# Patient Record
Sex: Female | Born: 1995 | Race: Black or African American | Hispanic: No | Marital: Single | Smoking: Never smoker
Health system: Southern US, Community
[De-identification: ages and names within clinical notes are randomized; demographics above are authoritative.]

---

## 2018-05-31 ENCOUNTER — Encounter (HOSPITAL_COMMUNITY): Payer: Self-pay | Admitting: Emergency Medicine

## 2018-05-31 ENCOUNTER — Ambulatory Visit (HOSPITAL_COMMUNITY)
Admission: EM | Admit: 2018-05-31 | Discharge: 2018-05-31 | Disposition: A | Discharge status: Home / Self Care | Payer: Managed Care, Other (non HMO) | Attending: Family Medicine | Admitting: Family Medicine

## 2018-05-31 DIAGNOSIS — S90221A Contusion of right lesser toe(s) with damage to nail, initial encounter: Secondary | ICD-10-CM

## 2018-05-31 DIAGNOSIS — S90222A Contusion of left lesser toe(s) with damage to nail, initial encounter: Secondary | ICD-10-CM

## 2018-05-31 NOTE — ED Provider Notes (Signed)
[facility]    CSN: [id number] Arrival date & time: 05/31/18  1204     History   Chief Complaint Chief Complaint  Patient presents with  . Toe Pain    HPI Angela Swanson is a 22 y.o. female.   Angela Swanson presents with complaints of toe pain to bilateral second toes. States yesterday was her first day back at school so she was walking a lot, wearing a new shoe with a device to prevent creasing to the toe box. She feels this likely made the shoe too tight. Today with pain to toe nails. No bleeding or blistering. No other injury. States nails feel a numb sensation but no numbness to toes. Without contributing medical history.      ROS per HPI.      History reviewed. No pertinent past medical history.  There are no active problems to display for this patient.   History reviewed. No pertinent surgical history.  OB History   None      Home Medications    Prior to Admission medications   Not on File    Family History Family History  Problem Relation Age of Onset  . Diabetes Father     Social History Social History   Tobacco Use  . Smoking status: Never Smoker  Substance Use Topics  . Alcohol use: Never    Frequency: Never  . Drug use: Never     Allergies   Patient has no known allergies.   Review of Systems Review of Systems   Physical Exam Triage Vital Signs ED Triage Vitals [05/31/18 1221]  Enc Vitals Group     BP (!) 141/79     Pulse Rate 87     Resp 18     Temp 98.3 F (36.8 C)     Temp src      SpO2 100 %     Weight      Height      Head Circumference      Peak Flow      Pain Score      Pain Loc      Pain Edu?      Excl. in GC?    No data found.  Updated Vital Signs BP (!) 141/79   Pulse 87   Temp 98.3 F (36.8 C)   Resp 18   LMP 05/16/2018   SpO2 100%   Visual Acuity Right Eye Distance:   Left Eye Distance:   Bilateral Distance:    Right Eye Near:   Left Eye Near:    Bilateral Near:     Physical Exam   Constitutional: She is oriented to person, place, and time. She appears well-developed and well-nourished. No distress.  Cardiovascular: Normal rate, regular rhythm and normal heart sounds.  Pulmonary/Chest: Effort normal and breath sounds normal.  Musculoskeletal:       Right ankle: Normal.       Left ankle: Normal.       Right foot: There is tenderness. There is no bony tenderness.       Left foot: There is tenderness. There is no bony tenderness.  Bilateral 2nd toes with slight bruising and tenderness to nails; very slight subungual hematomas present bilaterally; sensation intact; no pain to bones of toes; cap refill < 2 seconds    Neurological: She is alert and oriented to person, place, and time.  Skin: Skin is warm and dry.     UC Treatments / Results  Labs (all labs  ordered are listed, but only abnormal results are displayed) Labs Reviewed - No data to display  EKG None  Radiology No results found.  Procedures Procedures (including critical care time)  Medications Ordered in UC Medications - No data to display  Initial Impression / Assessment and Plan / UC Course  I have reviewed the triage vital signs and the nursing notes.  Pertinent labs & imaging results that were available during my care of the patient were reviewed by me and considered in my medical decision making (see chart for details).     Inadequate hematoma's for drainage at this time. Emphasized significance of appropriate fitting and laced shoes to prevent further pain. Ice application, ibuprofen as needed. Follow up with podiatry for any worsening or persistent symptoms.  Final Clinical Impressions(s) / UC Diagnoses   Final diagnoses:  Contusion of toenail of left foot, initial encounter  Contusion of toenail of right foot, initial encounter     Discharge Instructions     Apply ice at end of day.  Wear well fitting shoes to prevent further damage to the toe nails, ensured laced adequately as  well.  These toe nails may fall off as new nails grow in.  If any worsening or persistent concerns please follow up with podiatry     ED Prescriptions    None     Controlled Substance Prescriptions [place] Controlled Substance Registry consulted? Not Applicable   Georgetta HaberBurky, Natalie B, NP 05/31/18 1312

## 2018-05-31 NOTE — Discharge Instructions (Signed)
Apply ice at end of day.  Wear well fitting shoes to prevent further damage to the toe nails, ensured laced adequately as well.  These toe nails may fall off as new nails grow in.  If any worsening or persistent concerns please follow up with podiatry

## 2018-05-31 NOTE — ED Triage Notes (Signed)
Pt states she was walking with some new shoes she thinks might be too tight, caused some bilateral toe pain. Pt ambulatory. Denies specific injury.

## 2018-08-08 ENCOUNTER — Ambulatory Visit (HOSPITAL_COMMUNITY)
Admission: EM | Admit: 2018-08-08 | Discharge: 2018-08-08 | Disposition: A | Discharge status: Home / Self Care | Payer: Managed Care, Other (non HMO) | Attending: Family Medicine | Admitting: Family Medicine

## 2018-08-08 ENCOUNTER — Encounter (HOSPITAL_COMMUNITY): Payer: Self-pay | Admitting: Emergency Medicine

## 2018-08-08 DIAGNOSIS — R350 Frequency of micturition: Secondary | ICD-10-CM | POA: Diagnosis not present

## 2018-08-08 LAB — POCT URINALYSIS DIP (DEVICE)
Bilirubin Urine: NEGATIVE
GLUCOSE, UA: NEGATIVE mg/dL
HGB URINE DIPSTICK: NEGATIVE
KETONES UR: NEGATIVE mg/dL
LEUKOCYTES UA: NEGATIVE
NITRITE: NEGATIVE
PH: 5.5 (ref 5.0–8.0)
PROTEIN: NEGATIVE mg/dL
SPECIFIC GRAVITY, URINE: 1.025 (ref 1.005–1.030)
UROBILINOGEN UA: 0.2 mg/dL (ref 0.0–1.0)

## 2018-08-08 LAB — POCT PREGNANCY, URINE: PREG TEST UR: NEGATIVE

## 2018-08-08 NOTE — ED Provider Notes (Signed)
[facility]    CSN: [id number] Arrival date & time: 08/08/18  1137     History   Chief Complaint Chief Complaint  Patient presents with  . Urinary Frequency    HPI Angela Swanson is a 22 y.o. female.   Pt is a 22 year old female that presents with urinary frequency, dark urine, missed period. This has been a problem for a dew days. Symptoms have been constant. She had an abortion earlier this month and shortly after started on OC. She is concerned because she hasn't started her period and worried that she is pregnant again. She is sexually active. She denies any abd pain, vaginal bleeding or discharge. She denies any dysuria, hematuria.   ROS per HPI      History reviewed. No pertinent past medical history.  There are no active problems to display for this patient.   History reviewed. No pertinent surgical history.  OB History   None      Home Medications    Prior to Admission medications   Medication Sig Start Date End Date Taking? Authorizing Provider  Norgestimate-Eth Estradiol (SPRINTEC 28 PO) Take by mouth.   Yes [provider]    Family History Family History  Problem Relation Age of Onset  . Diabetes Father     Social History Social History   Tobacco Use  . Smoking status: Never Smoker  . Smokeless tobacco: Never Used  Substance Use Topics  . Alcohol use: Never    Frequency: Never  . Drug use: Never     Allergies   Patient has no known allergies.   Review of Systems Review of Systems   Physical Exam Triage Vital Signs ED Triage Vitals  Enc Vitals Group     BP 08/08/18 1209 132/68     Pulse Rate 08/08/18 1209 76     Resp 08/08/18 1209 16     Temp 08/08/18 1209 98.6 F (37 C)     Temp Source 08/08/18 1209 Oral     SpO2 08/08/18 1209 100 %     Weight --      Height 08/08/18 1210 5\' 9"  (1.753 m)     Head Circumference --      Peak Flow --      Pain Score 08/08/18 1209 3     Pain Loc --      Pain Edu? --       Excl. in GC? --    No data found.  Updated Vital Signs BP 132/68 (BP Location: Right Arm)   Pulse 76   Temp 98.6 F (37 C) (Oral)   Resp 16   Ht 5\' 9"  (1.753 m)   LMP 05/16/2018   SpO2 100%   Visual Acuity Right Eye Distance:   Left Eye Distance:   Bilateral Distance:    Right Eye Near:   Left Eye Near:    Bilateral Near:     Physical Exam  Constitutional: She appears well-developed and well-nourished.  Very pleasant. Non toxic or ill appearing.   HENT:  Head: Normocephalic and atraumatic.  Eyes: Conjunctivae are normal.  Neck: Normal range of motion.  Pulmonary/Chest: Effort normal.  Abdominal: Soft. Bowel sounds are normal.  Abdomen soft, non tender. No CVA tenderness. No rebound tenderness.   Genitourinary:  Genitourinary Comments: Deferred.   Musculoskeletal: Normal range of motion.  Neurological: She is alert.  Skin: Skin is warm and dry.  Psychiatric: She has a normal mood and affect.  Nursing note  and vitals reviewed.    UC Treatments / Results  Labs (all labs ordered are listed, but only abnormal results are displayed) Labs Reviewed  POCT URINALYSIS DIP (DEVICE)  POCT PREGNANCY, URINE    EKG None  Radiology No results found.  Procedures Procedures (including critical care time)  Medications Ordered in UC Medications - No data to display  Initial Impression / Assessment and Plan / UC Course  I have reviewed the triage vital signs and the nursing notes.  Pertinent labs & imaging results that were available during my care of the patient were reviewed by me and considered in my medical decision making (see chart for details).     Urine negative for pregnancy and infection.  Ensured pt that is could be a few months before her menstrual cycle regulates.  [facility]   Final Clinical Impressions(s) / UC Diagnoses   Final diagnoses:  Urinary frequency     Discharge Instructions       Urine was negative for pregnancy or infection Your urine could be darker because of your dehydration please make sure you are drinking plenty water As we spoke about it could take 3 months or more before the menstrual cycle gets regulated completely For any continued problems or worsening symptoms or complaints please [facility] with [facility]    ED Prescriptions    None     Controlled Substance Prescriptions [place] Controlled Substance Registry consulted? Not Applicable   Janace Aris, NP 08/09/18 [phone]

## 2018-08-08 NOTE — ED Triage Notes (Signed)
Pt presents to [facility] for assessment of urinary frequency, darker urine in the mornings, and a missed period.  States she had an abortion last month, and she started oral birth control, and just wants to be sure everything is okay with her cycle.

## 2018-08-08 NOTE — Discharge Instructions (Addendum)
Urine was negative for pregnancy or infection Your urine could be darker because of your dehydration please make sure you are drinking plenty water As we spoke about it could take 3 months or more before the menstrual cycle gets regulated completely For any continued problems or worsening symptoms or complaints please follow-[facility] with [facility]

## 2018-11-08 ENCOUNTER — Other Ambulatory Visit: Payer: Self-pay

## 2018-11-08 ENCOUNTER — Encounter (HOSPITAL_COMMUNITY): Payer: Self-pay

## 2018-11-08 ENCOUNTER — Ambulatory Visit (HOSPITAL_COMMUNITY)
Admission: EM | Admit: 2018-11-08 | Discharge: 2018-11-08 | Disposition: A | Discharge status: Home / Self Care | Payer: BLUE CROSS/BLUE SHIELD | Attending: Internal Medicine | Admitting: Internal Medicine

## 2018-11-08 DIAGNOSIS — J039 Acute tonsillitis, unspecified: Secondary | ICD-10-CM | POA: Diagnosis present

## 2018-11-08 LAB — POCT INFECTIOUS MONO SCREEN: MONO SCREEN: NEGATIVE

## 2018-11-08 LAB — POCT RAPID STREP A: Streptococcus, Group A Screen (Direct): NEGATIVE

## 2018-11-08 MED ORDER — PENICILLIN V POTASSIUM 500 MG PO TABS: ORAL_TABLET | ORAL | 0 refills | Status: DC

## 2018-11-08 NOTE — ED Triage Notes (Signed)
Pt cc sore throat x 2 days. 

## 2018-11-08 NOTE — Discharge Instructions (Addendum)
Gargle with warm salt water 2-3 times a day for a couple of day to clean out your tonsils Take Tylenol or Motrin for pain. Start taking the penicillin til gone, specially if the culture is positive, unless we tell you that you dont need to continue it when the culture is back. If you get worse, you may need to have the mono repeated since it take 7 or more days for the test to turn positive.

## 2018-11-08 NOTE — ED Provider Notes (Signed)
[facility]    CSN: [id number] Arrival date & time: 11/08/18  1927     History   Chief Complaint Chief Complaint  Patient presents with  . Sore Throat    HPI Angela Swanson is a 23 y.o. female.   Who presents with ST x 2 days. She denies fever, URI symptoms or GI symptoms. She denies fatigue or trouble swallowing and has worked a full shift today. Today has had a HA and her neck aches on the back of it bilaterally. She denies being sick with any nose congestion or allergies in the past month.     History reviewed. No pertinent past medical history.  There are no active problems to display for this patient.   History reviewed. No pertinent surgical history.  OB History   No obstetric history on file.      Home Medications    Prior to Admission medications   Medication Sig Start Date End Date Taking? Authorizing Provider  Norgestimate-Eth Estradiol (SPRINTEC 28 PO) Take by mouth.    [provider]  penicillin v potassium (VEETID) 500 MG tablet One bid x 10 days 11/08/18   Rodriguez-Southworth, Nettie ElmSylvia, PA-C    Family History Family History  Problem Relation Age of Onset  . Diabetes Father     Social History Social History   Tobacco Use  . Smoking status: Never Smoker  . Smokeless tobacco: Never Used  Substance Use Topics  . Alcohol use: Never    Frequency: Never  . Drug use: Never     Allergies   Patient has no known allergies.   Review of Systems Review of Systems  Constitutional: Positive for chills and diaphoresis. Negative for appetite change, fatigue and fever.  HENT: Positive for ear pain and sore throat. Negative for congestion, dental problem, drooling, ear discharge, facial swelling, mouth sores, postnasal drip, rhinorrhea, sinus pressure, sinus pain, trouble swallowing and voice change.   Eyes: Negative for discharge.  Respiratory: Negative for cough and shortness of breath.   Cardiovascular: Negative for chest pain.    Gastrointestinal: Negative for abdominal pain.  Musculoskeletal: Positive for neck pain. Negative for gait problem, myalgias and neck stiffness.  Skin: Negative for rash.  Allergic/Immunologic: Negative for environmental allergies.  Neurological: Positive for headaches.  Hematological: Positive for adenopathy.   Physical Exam Triage Vital Signs ED Triage Vitals  Enc Vitals Group     BP 11/08/18 2002 132/68     Pulse Rate 11/08/18 2002 75     Resp 11/08/18 2002 16     Temp 11/08/18 2002 97.8 F (36.6 C)     Temp Source 11/08/18 2002 Tympanic     SpO2 11/08/18 2002 100 %     Weight 11/08/18 2003 177 lb (80.3 kg)     Height --      Head Circumference --      Peak Flow --      Pain Score 11/08/18 2003 7     Pain Loc --      Pain Edu? --      Excl. in GC? --    No data found.  Updated Vital Signs BP 132/68 (BP Location: Right Arm)   Pulse 75   Temp 97.8 F (36.6 C) (Tympanic)   Resp 16   Wt 177 lb (80.3 kg)   LMP 10/11/2018   SpO2 100%   BMI 26.14 kg/m   Visual Acuity Right Eye Distance:   Left Eye Distance:   Bilateral Distance:  Right Eye Near:   Left Eye Near:    Bilateral Near:     Physical Exam Vitals signs and nursing note reviewed.  Constitutional:      General: She is not in acute distress.    Appearance: She is well-developed. She is not toxic-appearing.  HENT:     Head: Normocephalic.     Right Ear: Tympanic membrane and ear canal normal. No drainage, swelling or tenderness. No middle ear effusion. Tympanic membrane is not erythematous.     Left Ear: Tympanic membrane and ear canal normal. No drainage, swelling or tenderness.  No middle ear effusion. Tympanic membrane is not erythematous.     Nose: No congestion or rhinorrhea.     Mouth/Throat:     Mouth: Mucous membranes are dry. No oral lesions.     Pharynx: Uvula midline. No pharyngeal swelling, oropharyngeal exudate, posterior oropharyngeal erythema or uvula swelling.     Tonsils: Tonsillar  exudate present. No tonsillar abscesses. Swelling: 2+ on the right. 2+ on the left.  Eyes:     Conjunctiva/sclera: Conjunctivae normal.  Neck:     Musculoskeletal: Neck supple.     Thyroid: No thyromegaly.     Comments: Has enlarged upper chain tonsillar nodes and upper posterior chain nodes bilaterally.  Cardiovascular:     Rate and Rhythm: Normal rate and regular rhythm.     Heart sounds: No murmur.  Pulmonary:     Effort: Pulmonary effort is normal.     Breath sounds: Normal breath sounds. No wheezing.  Abdominal:     General: Bowel sounds are normal.  Lymphadenopathy:     Cervical: Cervical adenopathy present.  Skin:    General: Skin is warm and dry.  Neurological:     General: No focal deficit present.     Mental Status: She is alert and oriented to person, place, and time.  Psychiatric:        Mood and Affect: Mood normal.        Behavior: Behavior normal.    UC Treatments / Results  Labs (all labs ordered are listed, but only abnormal results are displayed) Labs Reviewed  CULTURE, GROUP A STREP [facility])  POCT INFECTIOUS MONO SCREEN  POCT RAPID STREP A  POCT INFECTIOUS MONO SCREEN  rapid strep is neg. Mono spot- neg  EKG None  Radiology No results found.  Procedures Procedures  Medications Ordered in UC Medications - No data to display  Initial Impression / Assessment and Plan / UC Course  I have reviewed the triage vital signs and the nursing notes. Pertinent labs results that were available during my care of the patient were reviewed by me and considered in my medical decision making (see chart for details). I sent a throat culture and started her on PNC 500 mg bid x 10 days. See instructions.    Final Clinical Impressions(s) / UC Diagnoses   Final diagnoses:  Exudative tonsillitis     Discharge Instructions     Gargle with warm salt water 2-3 times a day for a couple of day to clean out your tonsils Take Tylenol or Motrin for pain. Start taking  the penicillin til gone, specially if the culture is positive, unless we tell you that you dont need to continue it when the culture is back. If you get worse, you may need to have the mono repeated since it take 7 or more days for the test to turn positive.      ED Prescriptions  Medication Sig Dispense Auth. Provider   penicillin v potassium (VEETID) 500 MG tablet One bid x 10 days 20 tablet Rodriguez-Southworth, Nettie Elm, PA-C     Controlled Substance Prescriptions [place] Controlled Substance Registry consulted?    Garey Ham, Cordelia Poche 11/08/18 2101

## 2018-11-11 LAB — CULTURE, GROUP A STREP (THRC)

## 2018-12-04 ENCOUNTER — Encounter (HOSPITAL_COMMUNITY): Payer: Self-pay | Admitting: Emergency Medicine

## 2018-12-04 ENCOUNTER — Ambulatory Visit (HOSPITAL_COMMUNITY)
Admission: EM | Admit: 2018-12-04 | Discharge: 2018-12-04 | Disposition: A | Discharge status: Home / Self Care | Payer: BLUE CROSS/BLUE SHIELD | Attending: Family Medicine | Admitting: Family Medicine

## 2018-12-04 ENCOUNTER — Telehealth (HOSPITAL_COMMUNITY): Payer: Self-pay | Admitting: Emergency Medicine

## 2018-12-04 DIAGNOSIS — N939 Abnormal uterine and vaginal bleeding, unspecified: Secondary | ICD-10-CM | POA: Diagnosis not present

## 2018-12-04 DIAGNOSIS — Z3202 Encounter for pregnancy test, result negative: Secondary | ICD-10-CM | POA: Diagnosis present

## 2018-12-04 LAB — POCT URINALYSIS DIP (DEVICE)
GLUCOSE, UA: NEGATIVE mg/dL
KETONES UR: NEGATIVE mg/dL
Leukocytes,Ua: NEGATIVE
Nitrite: NEGATIVE
PROTEIN: 30 mg/dL — AB
Specific Gravity, Urine: >=1.03 (ref 1.005–1.030)
Urobilinogen, UA: 0.2 mg/dL (ref 0.0–1.0)
pH: 5.5 (ref 5.0–8.0)

## 2018-12-04 LAB — HCG, QUANTITATIVE, PREGNANCY: hCG, Beta Chain, Quant, S: <1 m[IU]/mL (ref <5)

## 2018-12-04 LAB — POCT PREGNANCY, URINE: PREG TEST UR: NEGATIVE

## 2018-12-04 NOTE — ED Provider Notes (Signed)
[facility]   [id number] 12/04/18 Arrival Time: 1335  CC: ABDOMINAL cramping and vaginal bleeding  SUBJECTIVE:  Angela Swanson is a 23 y.o. female who presents with complaint of resolved abdominal cramping x >1 week, and vaginal spotting x 2 days.  LMP 11/11/2018.  Denies hx of abnormal or irregular periods.  Currently not on birth control and is sexually active.  Last sex was 1 week ago.  Took a pregnancy test at home that was negative.  Denies alleviating or aggravating factors.  Reports similar symptoms in the past when she was pregnant last August.  Had elective abortion without complications.  Complains of associated nausea, fatigue, and breast tenderness.    Denies fever, chills, appetite changes, vomiting, chest pain, SOB, diarrhea, constipation, hematochezia, melena, dysuria, difficulty urinating, increased frequency or urgency, flank pain, loss of bowel or bladder function, vaginal discharge, vaginal odor, dyspareunia, pelvic pain.     ROS: As per HPI.  History reviewed. No pertinent past medical history. History reviewed. No pertinent surgical history. No Known Allergies No current facility-administered medications on file prior to encounter.    No current outpatient medications on file prior to encounter.   Social History   Socioeconomic History  . Marital status: Single    Spouse name: Not on file  . Number of children: Not on file  . Years of education: Not on file  . Highest education level: Not on file  Occupational History  . Not on file  Social Needs  . Financial resource strain: Not on file  . Food insecurity:    Worry: Not on file    Inability: Not on file  . Transportation needs:    Medical: Not on file    Non-medical: Not on file  Tobacco Use  . Smoking status: Never Smoker  . Smokeless tobacco: Never Used  Substance and Sexual Activity  . Alcohol use: Never    Frequency: Never  . Drug use: Never  . Sexual activity: Not on file  Lifestyle    . Physical activity:    Days per week: Not on file    Minutes per session: Not on file  . Stress: Not on file  Relationships  . Social connections:    Talks on phone: Not on file    Gets together: Not on file    Attends religious service: Not on file    Active member of club or organization: Not on file    Attends meetings of clubs or organizations: Not on file    Relationship status: Not on file  . Intimate partner violence:    Fear of current or ex partner: Not on file    Emotionally abused: Not on file    Physically abused: Not on file    Forced sexual activity: Not on file  Other Topics Concern  . Not on file  Social History Narrative  . Not on file   Family History  Problem Relation Age of Onset  . Diabetes Father      OBJECTIVE:  Vitals:   12/04/18 1348  BP: (!) 145/76  Pulse: 80  Resp: 16  Temp: 99 F (37.2 C)  SpO2: 100%    General appearance: Alert; NAD HEENT: NCAT.  Oropharynx clear.  Lungs: clear to auscultation bilaterally without adventitious breath sounds Heart: regular rate and rhythm.  Radial pulses 2+ symmetrical bilaterally Abdomen: soft, non-distended; normal active bowel sounds; non-tender to light and deep palpation; nontender at McBurney's point; negative Murphy's sign; no guarding Back: no  CVA tenderness Extremities: no edema; symmetrical with no gross deformities Skin: warm and dry Neurologic: normal gait Psychological: alert and cooperative; normal mood and affect  LABS: Results for orders placed or performed during the hospital encounter of 12/04/18 (from the past 24 hour(s))  POCT urinalysis dip (device)     Status: Abnormal   Collection Time: 12/04/18  1:53 PM  Result Value Ref Range   Glucose, UA NEGATIVE NEGATIVE mg/dL   Bilirubin Urine SMALL (A) NEGATIVE   Ketones, ur NEGATIVE NEGATIVE mg/dL   Specific Gravity, Urine >=1.030 1.005 - 1.030   Hgb urine dipstick TRACE (A) NEGATIVE   pH 5.5 5.0 - 8.0   Protein, ur 30 (A)  NEGATIVE mg/dL   Urobilinogen, UA 0.2 0.0 - 1.0 mg/dL   Nitrite NEGATIVE NEGATIVE   Leukocytes,Ua NEGATIVE NEGATIVE  Pregnancy, urine POC     Status: None   Collection Time: 12/04/18  1:58 PM  Result Value Ref Range   Preg Test, Ur NEGATIVE NEGATIVE   ASSESSMENT & PLAN:  1. Vaginal spotting   2. Urine pregnancy test negative     Urine pregnancy was negative  Blood test for pregnancy ordered.  We will call you with abnormal results by the end of the day.  If you have not heard from [place] by the end of the day results were unremarkable If results are positive go to [facility] ED immediately for further evaluation and management  If negative Rest and push fluids Use OTC ibuprofen and/or tylenol as needed for pain Follow up with PCP for further evaluation and management  Return or go to the ED if you have any of the following: fever, chills, persistent vomiting, abdominal pain, worsening vaginal bleeding, vaginal pain, vaginal discharge or odor, etc...  Reviewed expectations re: course of current medical issues. Questions answered. Outlined signs and symptoms indicating need for more acute intervention. Patient verbalized understanding. After Visit Summary given.   Rennis Harding, PA-C 12/04/18 1446

## 2018-12-04 NOTE — ED Triage Notes (Signed)
Pt c/o fatigue, spotting, abdominal cramping x1 week, states she thought she was pregnancy test and test came negative.

## 2018-12-04 NOTE — Telephone Encounter (Signed)
Patient contacted and made aware of all results, all questions answered.   

## 2018-12-04 NOTE — Discharge Instructions (Signed)
Urine pregnancy was negative  Blood test for pregnancy ordered.  We will call you with abnormal results by the end of the day.  If you have not heard from [place] by the end of the day results were unremarkable If results are positive go to [facility] ED immediately for further evaluation and management  If negative Rest and push fluids Use OTC ibuprofen and/or tylenol as needed for pain Follow up with PCP for further evaluation and management  Return or go to the ED if you have any of the following: fever, chills, persistent vomiting, abdominal pain, worsening vaginal bleeding, vaginal pain, vaginal discharge or odor, etc..Marland Kitchen

## 2019-01-07 ENCOUNTER — Telehealth: Payer: Self-pay

## 2019-01-07 NOTE — Telephone Encounter (Signed)
Patient called stating she thinks she had a miscarriage, reports she did not have her period in March and positive UPT, now she is bleeding. I advised pt go to hospital to be evaluated and follow up here as needed, pt verbalized understanding.

## 2019-03-06 ENCOUNTER — Ambulatory Visit (HOSPITAL_COMMUNITY)
Admission: EM | Admit: 2019-03-06 | Discharge: 2019-03-06 | Disposition: A | Discharge status: Home / Self Care | Payer: BLUE CROSS/BLUE SHIELD | Attending: Family Medicine | Admitting: Family Medicine

## 2019-03-06 ENCOUNTER — Other Ambulatory Visit: Payer: Self-pay

## 2019-03-06 ENCOUNTER — Ambulatory Visit (INDEPENDENT_AMBULATORY_CARE_PROVIDER_SITE_OTHER): Payer: BLUE CROSS/BLUE SHIELD

## 2019-03-06 ENCOUNTER — Encounter (HOSPITAL_COMMUNITY): Payer: Self-pay | Admitting: Emergency Medicine

## 2019-03-06 DIAGNOSIS — R079 Chest pain, unspecified: Secondary | ICD-10-CM | POA: Diagnosis not present

## 2019-03-06 DIAGNOSIS — R0789 Other chest pain: Secondary | ICD-10-CM

## 2019-03-06 MED ORDER — DICLOFENAC SODIUM 75 MG PO TBEC: 75.0000 mg | DELAYED_RELEASE_TABLET | Freq: Two times a day (BID) | ORAL | 0 refills | Status: AC

## 2019-03-06 NOTE — ED Triage Notes (Signed)
Right chest and breast is painful .  pain radiates over shoulder to right upper back.  No known injury.

## 2019-03-06 NOTE — Discharge Instructions (Addendum)
You have been seen at the [facility] today for chest pain. Your evaluation today was not suggestive of any emergent condition requiring medical intervention at this time. Your ECG (heart tracing) did not show any worrisome changes. However, some medical problems make take more time to appear. Therefore, it's very important that you pay attention to any new symptoms or worsening of your current condition.  Please proceed directly to the Emergency Department immediately should you feel worse in any way or have any of the following symptoms: increasing or different chest pain, pain that spreads to your arm, neck, jaw, back or abdomen, shortness of breath, or nausea and vomiting.  

## 2019-03-06 NOTE — ED Provider Notes (Signed)
[facility]   [id number] 03/06/19 Arrival Time: 1315  ASSESSMENT & PLAN:  1. Chest pain, unspecified type    I have personally viewed the imaging studies ordered this visit. Normal. No pneumothorax.  Likely MSK etiology. Discussed. Trial of: Meds ordered this encounter  Medications  . diclofenac (VOLTAREN) 75 MG EC tablet    Sig: Take 1 tablet (75 mg total) by mouth 2 (two) times daily.    Dispense:  14 tablet    Refill:  0   Chest pain precautions given; see AVS. Reviewed expectations re: course of current medical issues. Questions answered. Outlined signs and symptoms indicating need for more acute intervention. Patient verbalized understanding. After Visit Summary given.   SUBJECTIVE: History from: patient.  Angela Swanson is a 23 y.o. female who presents with complaint of fairly persistent non-positional R-sided chest and upper back pain. Describes as dull with occasional sharp pain. Sometimes worsened with a deep breath. No SOB or wheezing reported. Gradual onset about 1.5 weeks ago. No chest or back injury/trauma reported. No associated n/v. Ambulatory without difficulty. Able to sleep through the night without pain waking her. No LE edema or calf swelling/pain.  When present, rates as 3/10 in intensity when present Denies: claudication, dyspnea, exertional chest pressure/discomfort, fatigue, irregular heart beat, lower extremity edema, near-syncope, orthopnea, palpitations, paroxysmal nocturnal dyspnea and syncope. Recent illnesses: none. Fever: absent. Ambulatory without assistance. Self/OTC treatment: none reported. History of similar: no. No OCP use.  Illicit drug use: none.  Social History   Tobacco Use  Smoking Status Never Smoker  Smokeless Tobacco Never Used   Social History   Substance and Sexual Activity  Alcohol Use Never  . Frequency: Never    ROS: As per HPI. All other systems negative.   OBJECTIVE:  Vitals:   03/06/19 1347  BP:  118/79  Pulse: 79  Resp: 18  Temp: 98.7 F (37.1 C)  TempSrc: Oral  SpO2: 100%    General appearance: alert, oriented, no acute distress Eyes: PERRLA; EOMI; conjunctivae normal HENT: normocephalic; atraumatic Neck: supple with FROM Lungs: without labored respirations; CTAB Heart: regular rate and rhythm without murmer Chest Wall: with mild anterior tenderness to palpation; mostly upper right Abdomen: soft, non-tender; bowel sounds normal; no masses or organomegaly; no guarding or rebound tenderness Extremities: without edema; without calf swelling or tenderness; symmetrical without gross deformities Skin: warm and dry; without  rash or lesions Psychological: alert and cooperative; normal mood and affect  Imaging: Dg Chest 2 View  Result Date: 03/06/2019 CLINICAL DATA:  Right-sided chest pain EXAM: CHEST - 2 VIEW COMPARISON:  None. FINDINGS: The heart size and mediastinal contours are within normal limits. Both lungs are clear. The visualized skeletal structures are unremarkable. IMPRESSION: No active cardiopulmonary disease. Electronically Signed   By: Katherine Mantlehristopher  Green M.D.   On: 03/06/2019 14:32     No Known Allergies  PMH: Tonsillitis.  Social History   Socioeconomic History  . Marital status: Single    Spouse name: Not on file  . Number of children: Not on file  . Years of education: Not on file  . Highest education level: Not on file  Occupational History  . Not on file  Social Needs  . Financial resource strain: Not on file  . Food insecurity:    Worry: Not on file    Inability: Not on file  . Transportation needs:    Medical: Not on file    Non-medical: Not on file  Tobacco Use  .  Smoking status: Never Smoker  . Smokeless tobacco: Never Used  Substance and Sexual Activity  . Alcohol use: Never    Frequency: Never  . Drug use: Never  . Sexual activity: Not on file  Lifestyle  . Physical activity:    Days per week: Not on file    Minutes per session:  Not on file  . Stress: Not on file  Relationships  . Social connections:    Talks on phone: Not on file    Gets together: Not on file    Attends religious service: Not on file    Active member of club or organization: Not on file    Attends meetings of clubs or organizations: Not on file    Relationship status: Not on file  . Intimate partner violence:    Fear of current or ex partner: Not on file    Emotionally abused: Not on file    Physically abused: Not on file    Forced sexual activity: Not on file  Other Topics Concern  . Not on file  Social History Narrative  . Not on file   Family History  Problem Relation Age of Onset  . Diabetes Father    History reviewed. No pertinent surgical history.   Mardella Layman, MD 03/06/19 1539

## 2021-01-17 IMAGING — DX CHEST - 2 VIEW
2 series · 2 of 2 positions shown · non-contrast
Comparison: None.

CLINICAL DATA: Right-sided chest pain

EXAM:
CHEST - 2 VIEW

[chest pa]
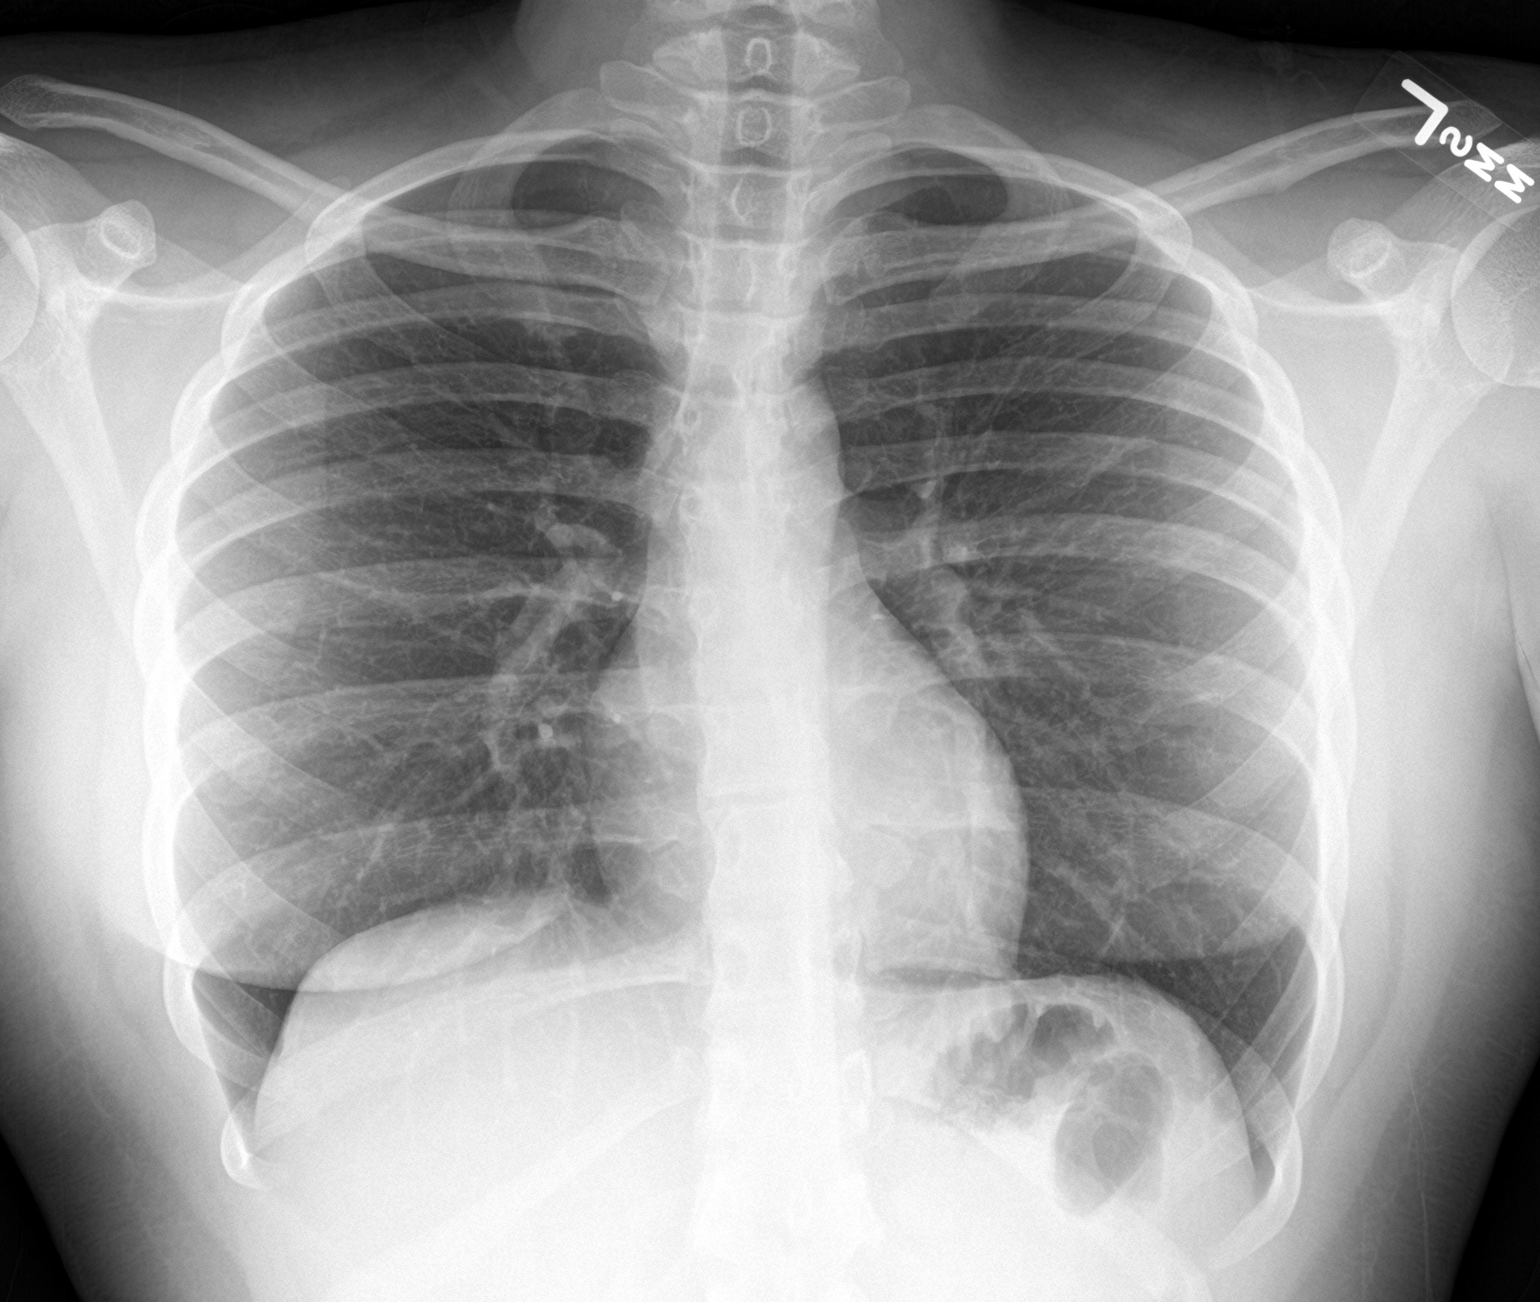

[chest lat]
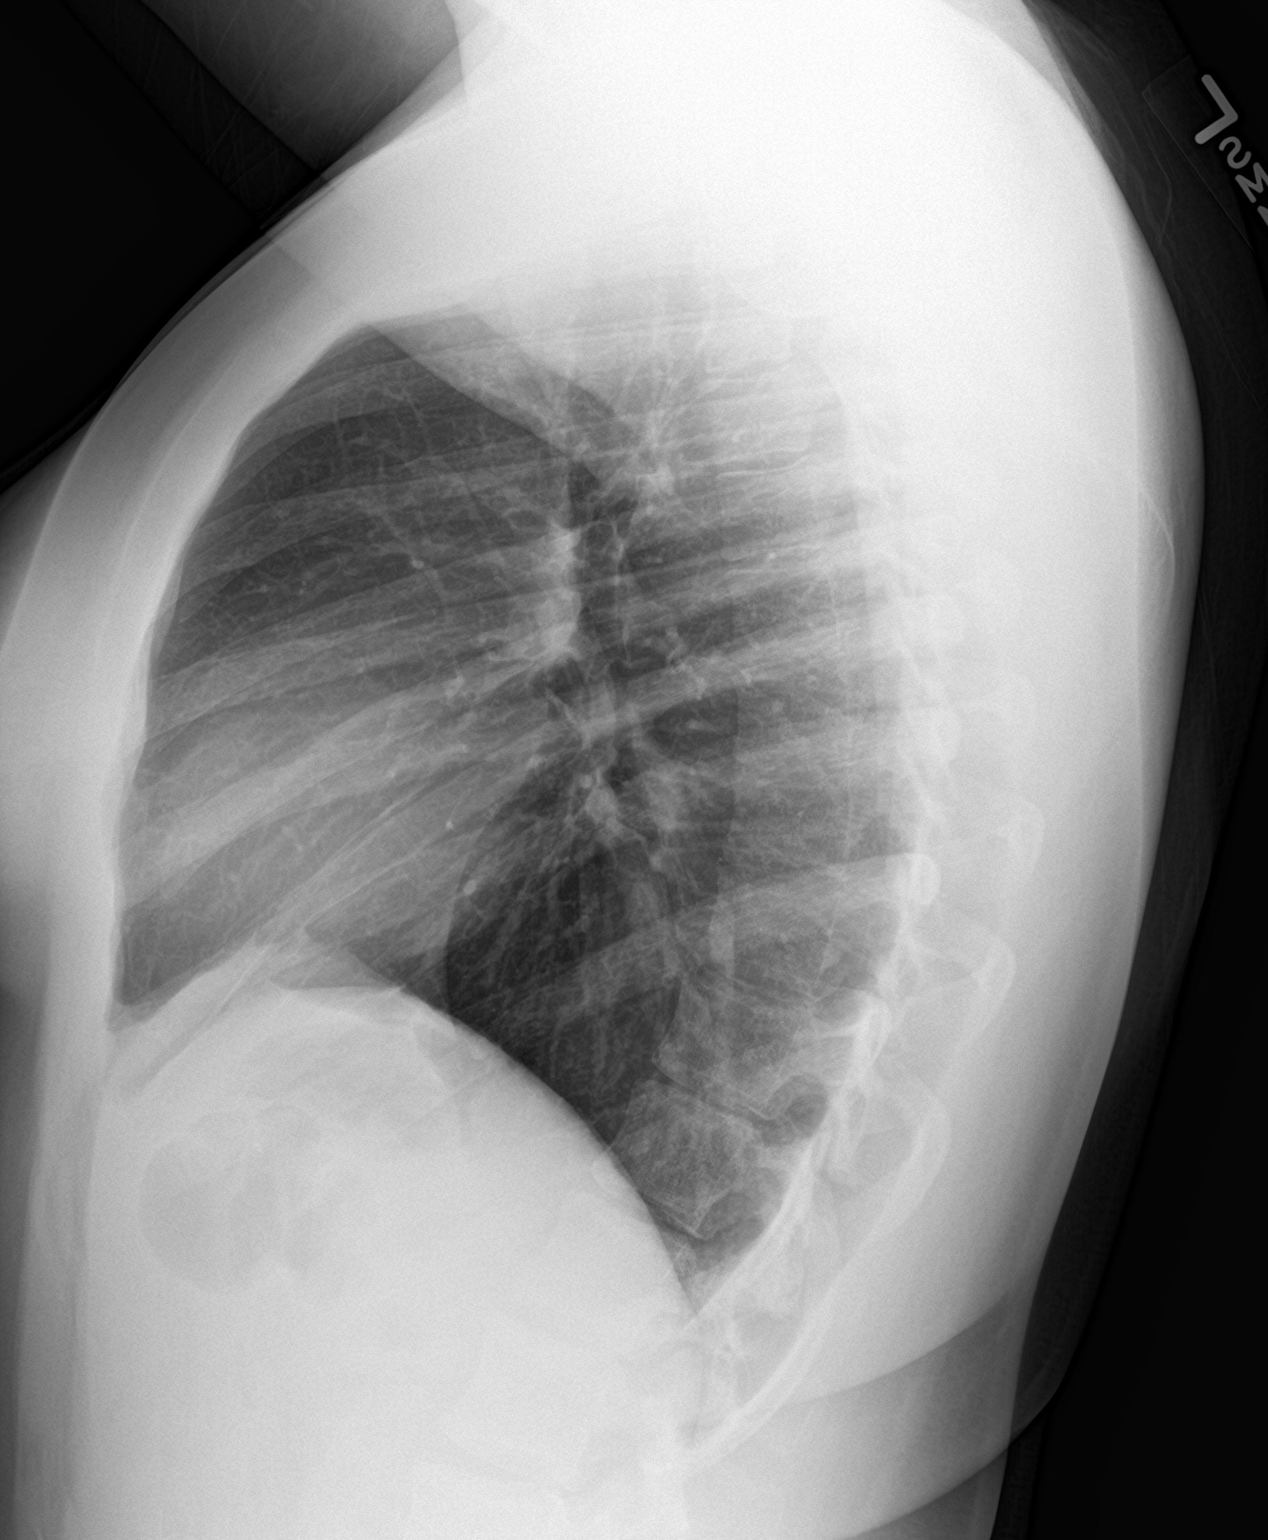

[2 of 2 positions shown; findings below may reference images not displayed]

FINDINGS: The heart size and mediastinal contours are within normal limits.
Both lungs are clear. The visualized skeletal structures are
unremarkable.
IMPRESSION: No active cardiopulmonary disease.
